# Patient Record
Sex: Male | Born: 1980 | Race: Black or African American | Hispanic: No | Marital: Married | Smoking: Never smoker
Health system: Southern US, Community
[De-identification: ages and names within clinical notes are randomized; demographics above are authoritative.]

---

## 2017-01-24 ENCOUNTER — Emergency Department (HOSPITAL_COMMUNITY)
Admission: EM | Admit: 2017-01-24 | Discharge: 2017-01-24 | Disposition: A | Discharge status: Home / Self Care | Payer: PRIVATE HEALTH INSURANCE | Attending: Emergency Medicine | Admitting: Emergency Medicine

## 2017-01-24 ENCOUNTER — Emergency Department (HOSPITAL_COMMUNITY): Payer: PRIVATE HEALTH INSURANCE

## 2017-01-24 ENCOUNTER — Encounter (HOSPITAL_COMMUNITY): Payer: Self-pay | Admitting: Emergency Medicine

## 2017-01-24 DIAGNOSIS — R109 Unspecified abdominal pain: Secondary | ICD-10-CM | POA: Diagnosis present

## 2017-01-24 DIAGNOSIS — R1031 Right lower quadrant pain: Secondary | ICD-10-CM | POA: Insufficient documentation

## 2017-01-24 LAB — URINALYSIS, ROUTINE W REFLEX MICROSCOPIC
BACTERIA UA: NONE SEEN
Bilirubin Urine: NEGATIVE
Glucose, UA: NEGATIVE mg/dL
KETONES UR: NEGATIVE mg/dL
LEUKOCYTES UA: NEGATIVE
Nitrite: NEGATIVE
PROTEIN: NEGATIVE mg/dL
Specific Gravity, Urine: 1.009 (ref 1.005–1.030)
pH: 7 (ref 5.0–8.0)

## 2017-01-24 LAB — COMPREHENSIVE METABOLIC PANEL
ALT: 34 U/L (ref 17–63)
AST: 31 U/L (ref 15–41)
Albumin: 4.3 g/dL (ref 3.5–5.0)
Alkaline Phosphatase: 55 U/L (ref 38–126)
Anion gap: 5 (ref 5–15)
BUN: 11 mg/dL (ref 6–20)
CHLORIDE: 104 mmol/L (ref 101–111)
CO2: 29 mmol/L (ref 22–32)
Calcium: 9.3 mg/dL (ref 8.9–10.3)
Creatinine, Ser: 1.23 mg/dL (ref 0.61–1.24)
GFR calc Af Amer: >60 mL/min (ref >60)
Glucose, Bld: 79 mg/dL (ref 65–99)
POTASSIUM: 4 mmol/L (ref 3.5–5.1)
SODIUM: 138 mmol/L (ref 135–145)
Total Bilirubin: 0.8 mg/dL (ref 0.3–1.2)
Total Protein: 7.5 g/dL (ref 6.5–8.1)

## 2017-01-24 LAB — CBC
HEMATOCRIT: 42.1 % (ref 39.0–52.0)
Hemoglobin: 14.2 g/dL (ref 13.0–17.0)
MCH: 28.7 pg (ref 26.0–34.0)
MCHC: 33.7 g/dL (ref 30.0–36.0)
MCV: 85.1 fL (ref 78.0–100.0)
Platelets: 252 10*3/uL (ref 150–400)
RBC: 4.95 MIL/uL (ref 4.22–5.81)
RDW: 13.7 % (ref 11.5–15.5)
WBC: 4 10*3/uL (ref 4.0–10.5)

## 2017-01-24 LAB — LIPASE, BLOOD: LIPASE: 22 U/L (ref 11–51)

## 2017-01-24 MED ORDER — IBUPROFEN 800 MG PO TABS
800.0000 mg | ORAL_TABLET | Freq: Once | ORAL | Status: AC
  Administered 2017-01-24: 800 mg via ORAL
  Filled 2017-01-24: qty 1

## 2017-01-24 MED ORDER — IBUPROFEN 800 MG PO TABS: 800.0000 mg | ORAL_TABLET | Freq: Three times a day (TID) | ORAL | 0 refills | Status: DC

## 2017-01-24 MED ORDER — IBUPROFEN 800 MG PO TABS: 800.0000 mg | ORAL_TABLET | Freq: Three times a day (TID) | ORAL | 0 refills | Status: AC | PRN

## 2017-01-24 NOTE — ED Triage Notes (Signed)
Per pt, states RLQ pain for 2 days-no N/V/D-no dysuria, no fevers

## 2017-01-24 NOTE — Discharge Instructions (Signed)

## 2017-01-24 NOTE — ED Provider Notes (Signed)
Emergency Department Provider Note   I have reviewed the triage vital signs and the nursing notes.   HISTORY  Chief Complaint Abdominal Pain   HPI Jesse Odom is a 36 y.o. male with no PMH presents to the emergency department for evaluation of right flank pain. Patient notes intermittent discomfort for the past 2 weeks. He has no history of kidney stones. A friend told him that I could be kidney stones and he started taking apple cider vinegar with relief of his back pain that his right side pain continues. No fevers or chills. No dysuria. No hematuria. No chest pain or difficulty breathing. He denies any anterior abdominal pain. Pain is made slightly worse with movement. Describes it as an aching quality with no significant alleviating factors. Moderate severity.   History reviewed. No pertinent past medical history.  There are no active problems to display for this patient.   History reviewed. No pertinent surgical history.    Allergies Patient has no known allergies.  No family history on file.  Social History Social History  Substance Use Topics  . Smoking status: Never Smoker  . Smokeless tobacco: Not on file  . Alcohol use No    Review of Systems  Constitutional: No fever/chills Eyes: No visual changes. ENT: No sore throat. Cardiovascular: Denies chest pain. Respiratory: Denies shortness of breath. Gastrointestinal: No abdominal pain.  No nausea, no vomiting.  No diarrhea.  No constipation. Genitourinary: Negative for dysuria. Positive right flank pain.  Musculoskeletal: Negative for back pain. Skin: Negative for rash. Neurological: Negative for headaches, focal weakness or numbness.  10-point ROS otherwise negative.  ____________________________________________   PHYSICAL EXAM:  VITAL SIGNS: ED Triage Vitals  Enc Vitals Group     BP 01/24/17 1227 150/60     Pulse Rate 01/24/17 1227 65     Resp 01/24/17 1227 18     Temp 01/24/17 1227 97.4  F (36.3 C)     Temp Source 01/24/17 1227 Oral     SpO2 01/24/17 1227 100 %     Pain Score 01/24/17 1239 8   Constitutional: Alert and oriented. Well appearing and in no acute distress. Eyes: Conjunctivae are normal.  Head: Atraumatic. Nose: No congestion/rhinnorhea. Mouth/Throat: Mucous membranes are moist.  Oropharynx non-erythematous. Neck: No stridor.   Cardiovascular: Normal rate, regular rhythm. Good peripheral circulation. Grossly normal heart sounds.   Respiratory: Normal respiratory effort.  No retractions. Lungs CTAB. Gastrointestinal: Soft and nontender. No distention. No CVA tenderness.  Musculoskeletal: No lower extremity tenderness nor edema. No gross deformities of extremities. Neurologic:  Normal speech and language. No gross focal neurologic deficits are appreciated.  Skin:  Skin is warm, dry and intact. No rash noted. Psychiatric: Mood and affect are normal. Speech and behavior are normal.  ____________________________________________   LABS (all labs ordered are listed, but only abnormal results are displayed)  Labs Reviewed  URINALYSIS, ROUTINE W REFLEX MICROSCOPIC - Abnormal; Notable for the following:       Result Value   Color, Urine STRAW (*)    Hgb urine dipstick SMALL (*)    Squamous Epithelial / LPF 0-5 (*)    All other components within normal limits  LIPASE, BLOOD  COMPREHENSIVE METABOLIC PANEL  CBC   ____________________________________________  RADIOLOGY  Ct Renal Stone Study  Result Date: 01/24/2017 CLINICAL DATA:  Right lower quadrant abdominal pain for 2 days. EXAM: CT ABDOMEN AND PELVIS WITHOUT CONTRAST TECHNIQUE: Multidetector CT imaging of the abdomen and pelvis was performed following the  standard protocol without IV contrast. COMPARISON:  None. FINDINGS: Lower chest: No acute abnormality. Hepatobiliary: Unremarkable unenhanced appearance of the liver and gallbladder. Pancreas: Unremarkable unenhanced appearance. Spleen: Unremarkable  unenhanced appearance. Adrenals/Urinary Tract: The adrenal glands and kidneys are unremarkable in appearance. No evidence of renal calculi or hydronephrosis. Ureters and bladder have a normal appearance. Stomach/Bowel: Bowel shows no evidence of obstruction or inflammation. The appendix is well visualized and normal. No evidence of appendicolith. No free air, abscess or free fluid. Vascular/Lymphatic: No enlarged lymph nodes are seen. Other: No hernias identified. Musculoskeletal: No acute or significant osseous findings. IMPRESSION: No acute findings in the abdomen or pelvis. Normal CT appearance of the appendix. Electronically Signed   By: Irish Lack M.D.   On: 01/24/2017 15:20    ____________________________________________   PROCEDURES  Procedure(s) performed:   Procedures  None ____________________________________________   INITIAL IMPRESSION / ASSESSMENT AND PLAN / ED COURSE  Pertinent labs & imaging results that were available during my care of the patient were reviewed by me and considered in my medical decision making (see chart for details).  Patient presents to the emergency department for evaluation of right flank pain for the past 2 weeks. Pain is worse with movement. No CVA tenderness. Labs are largely unremarkable. No history of kidney stone. Pain may be musculoskeletal in etiology. Plan for noncontrast CT to evaluate for index case of kidney stone. Low suspicion for appendicitis with no anterior abdominal tenderness. Right upper quadrant is nontender to palpation. No testicular pain or symptoms.   03:28 PM Labs including urinalysis are normal. CT shows no evidence of stone. The appendix is well-visualized and normal. Plan for Motrin as needed for pain and suspect underlying musculoskeletal etiology. Discussed my plan and impression with the patient.  At this time, I do not feel there is any life-threatening condition present. I have reviewed and discussed all results  (EKG, imaging, lab, urine as appropriate), exam findings with patient. I have reviewed nursing notes and appropriate previous records.  I feel the patient is safe to be discharged home without further emergent workup. Discussed usual and customary return precautions. Patient and family (if present) verbalize understanding and are comfortable with this plan.  Patient will follow-up with their primary care provider. If they do not have a primary care provider, information for follow-up has been provided to them. All questions have been answered.  ____________________________________________  FINAL CLINICAL IMPRESSION(S) / ED DIAGNOSES  Final diagnoses:  Right flank pain     MEDICATIONS GIVEN DURING THIS VISIT:  Medications  ibuprofen (ADVIL,MOTRIN) tablet 800 mg (800 mg Oral Given 01/24/17 1445)     NEW OUTPATIENT MEDICATIONS STARTED DURING THIS VISIT:  Current Discharge Medication List    START taking these medications   Details  ibuprofen (ADVIL,MOTRIN) 800 MG tablet Take 1 tablet (800 mg total) by mouth every 8 (eight) hours as needed. Qty: 21 tablet, Refills: 0          Note:  This document was prepared using Dragon voice recognition software and may include unintentional dictation errors.  Alona Bene, MD Emergency Medicine   Maia Plan, MD 01/24/17 [phone]

## 2018-01-23 IMAGING — CT CT RENAL STONE PROTOCOL
2 of 3 series · 17 of 46 positions shown, 19 images · non-contrast
Comparison: None.

CLINICAL DATA: Right lower quadrant abdominal pain for 2 days.

EXAM:
CT ABDOMEN AND PELVIS WITHOUT CONTRAST
TECHNIQUE: Multidetector CT imaging of the abdomen and pelvis was performed
following the standard protocol without IV contrast.

[Series 4: lung · axial · 0.80mm/px · z∈[+376,+460]mm · 14 of 50 slices shown, 16 images]
[im 4/50  soft-tissue]
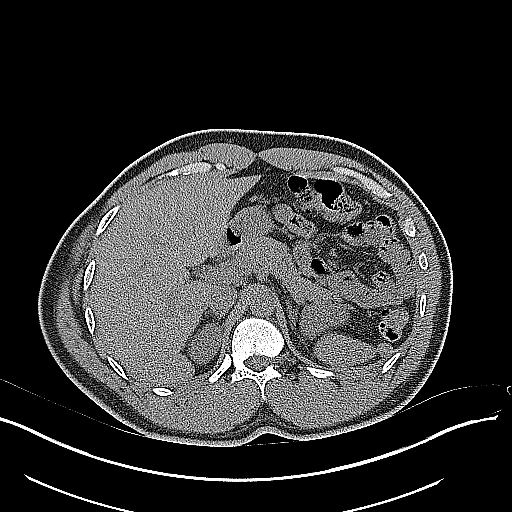
[im 4/50  bone]
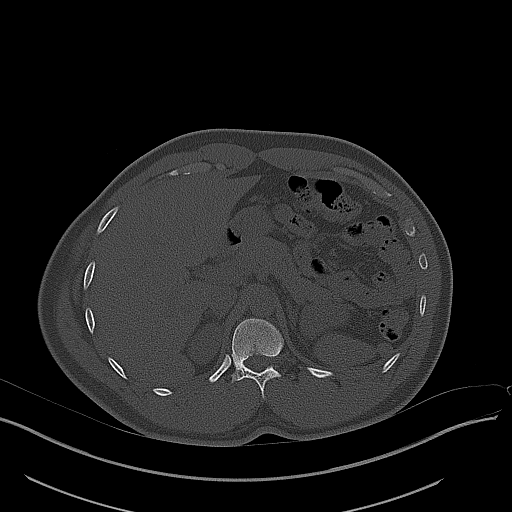
[im 7/50  soft-tissue]
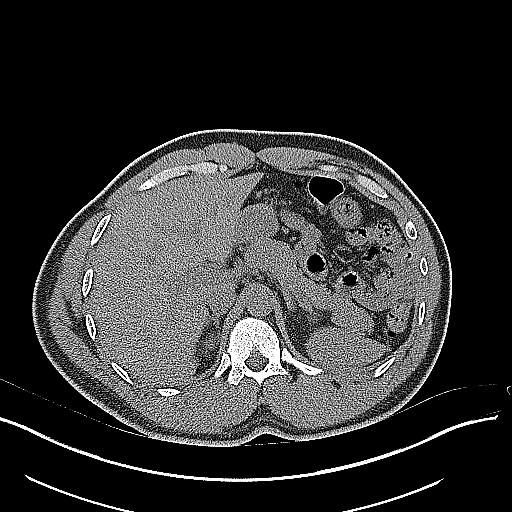
[im 10/50  soft-tissue]
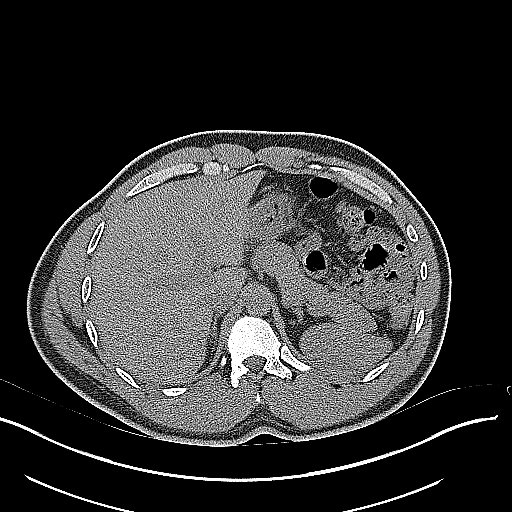
[im 13/50  soft-tissue]
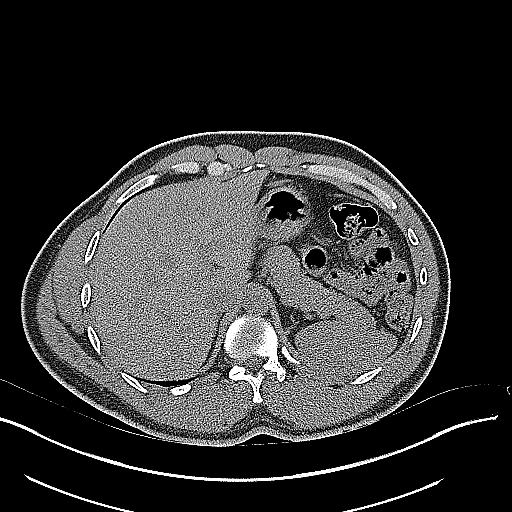
[im 16/50  soft-tissue]
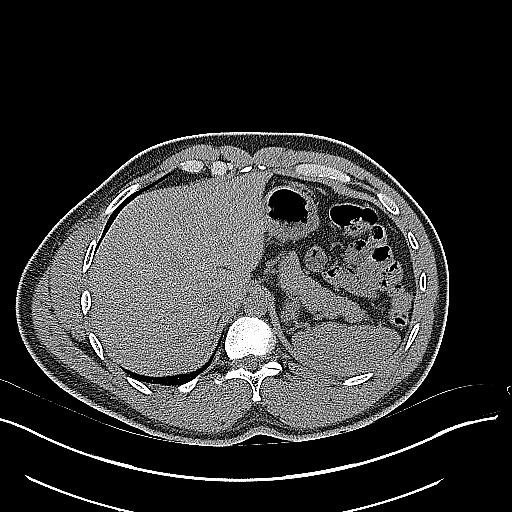
[im 19/50  soft-tissue]
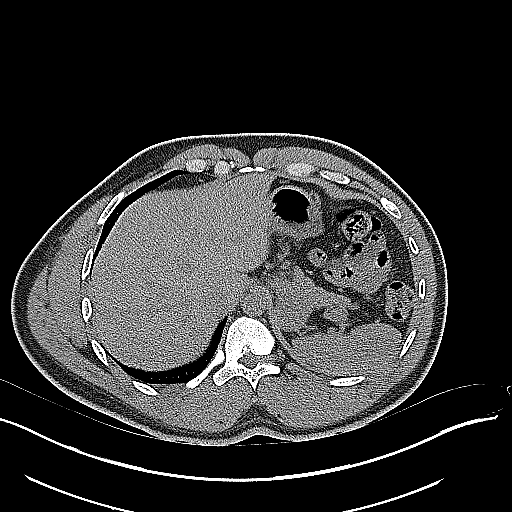
[im 23/50  soft-tissue]
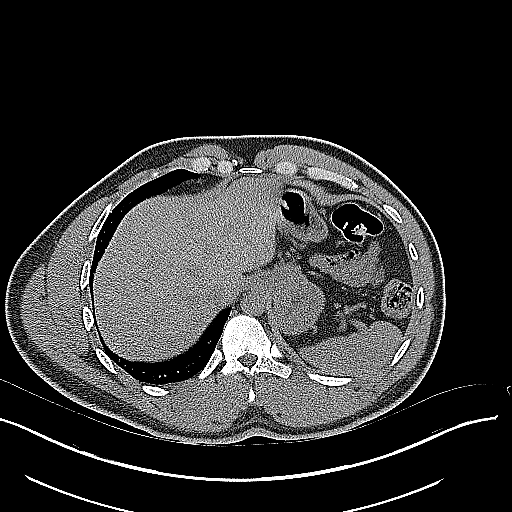
[im 27/50  soft-tissue]
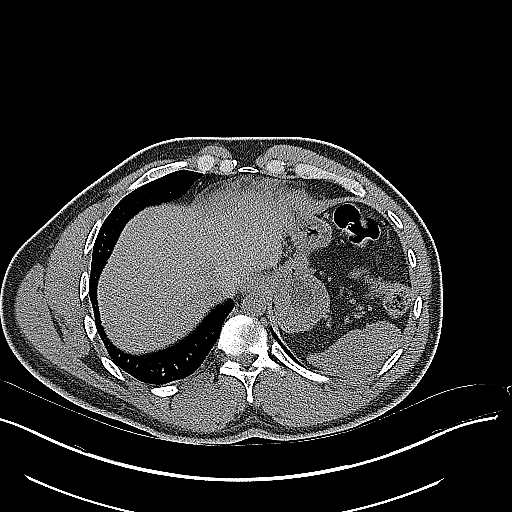
[im 31/50  soft-tissue]
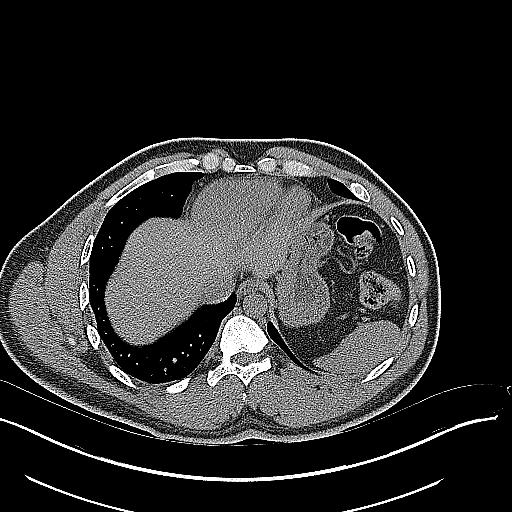
[im 31/50  bone]
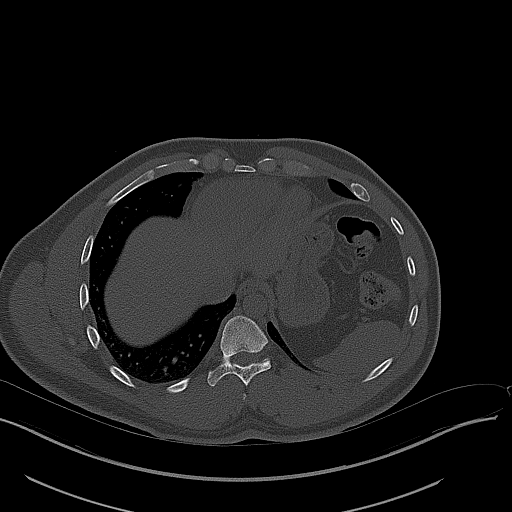
[im 34/50  soft-tissue]
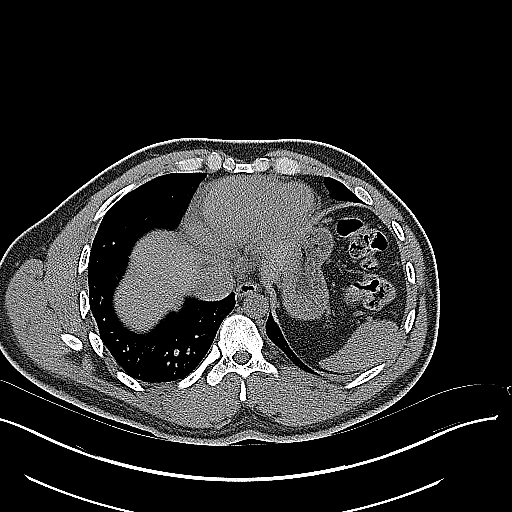
[im 37/50  soft-tissue]
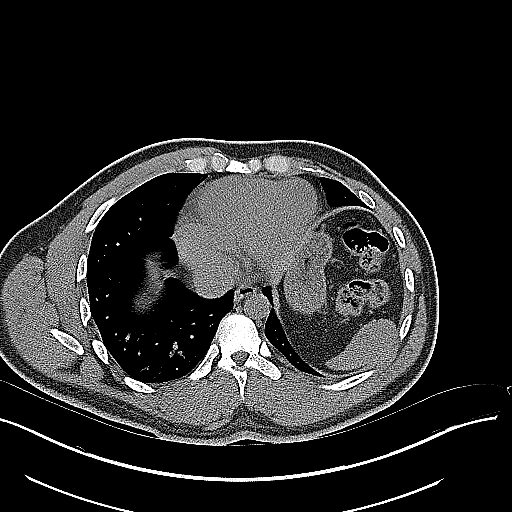
[im 40/50  soft-tissue]
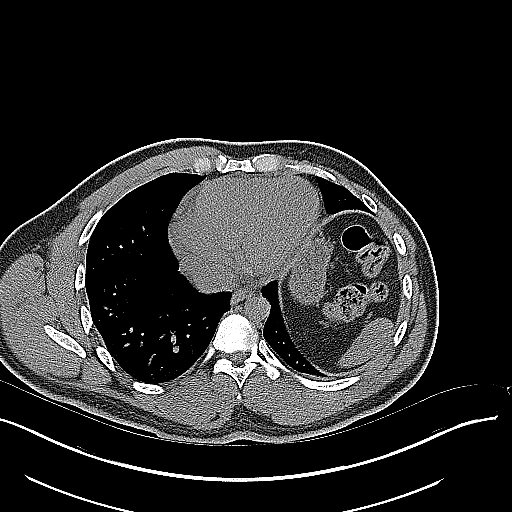
[im 43/50  soft-tissue]
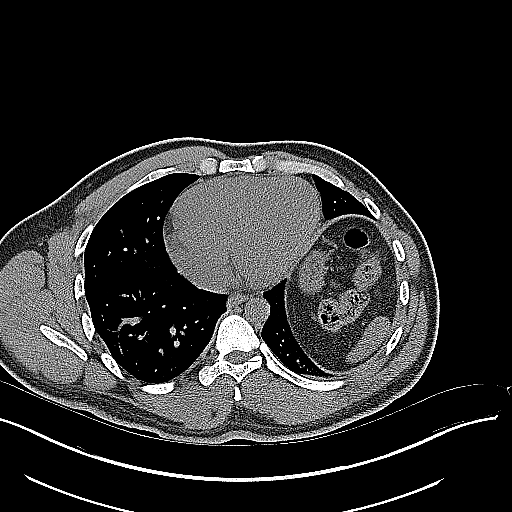
[im 46/50  soft-tissue]
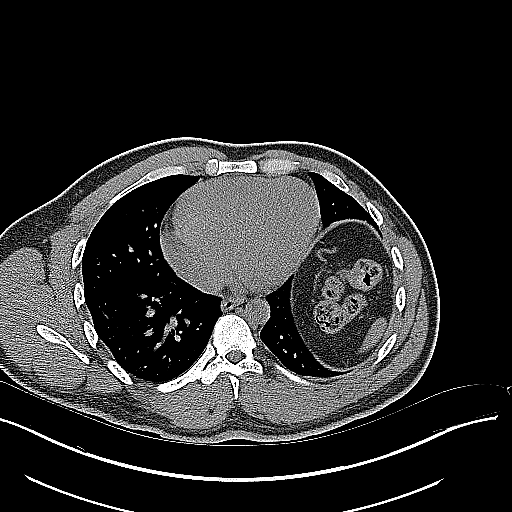

[Series 5: coronal · coronal · 0.74mm/px · 3 of 148 slices shown]
[im 50/148  soft-tissue]
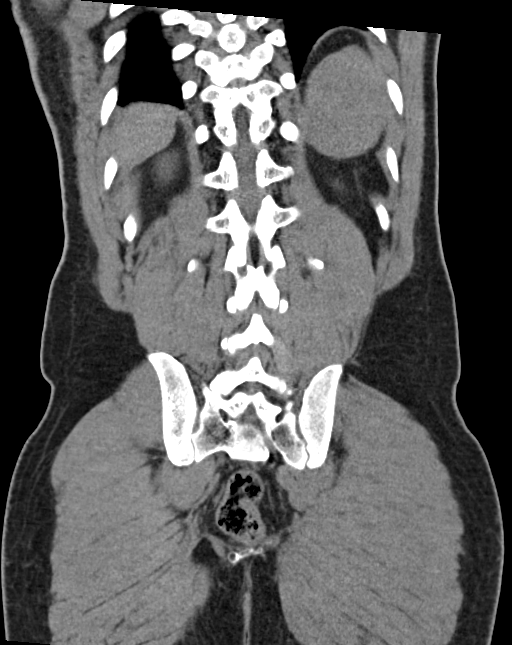
[im 66/148  soft-tissue]
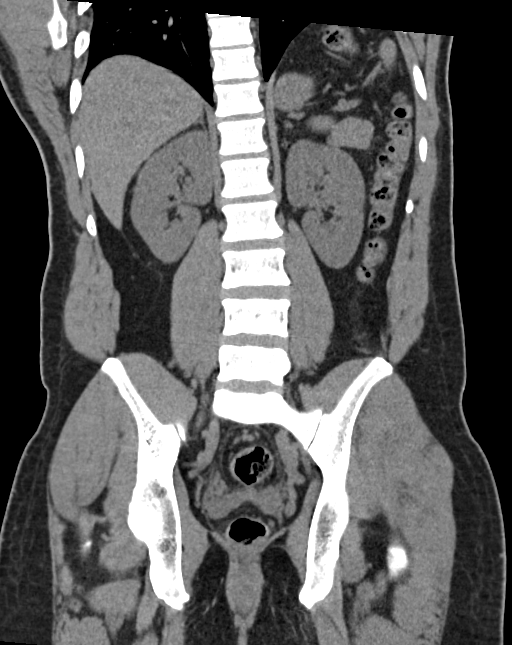
[im 82/148  soft-tissue]
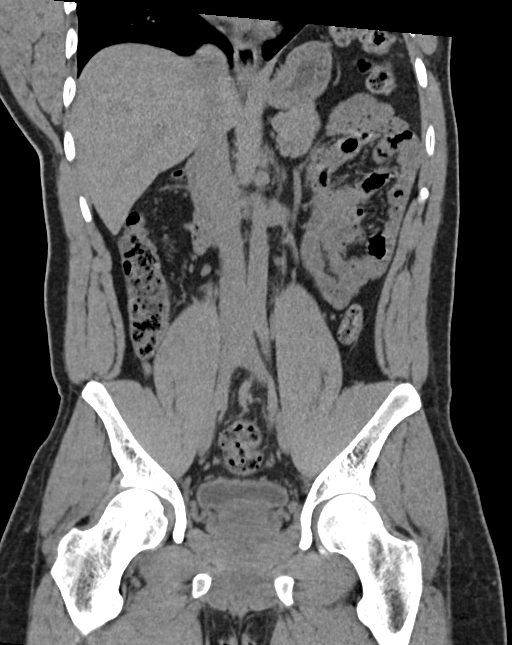

[17 of 46 positions shown; findings below may reference images not displayed]

FINDINGS: Lower chest: No acute abnormality.

Hepatobiliary: Unremarkable unenhanced appearance of the liver and
gallbladder.

Pancreas: Unremarkable unenhanced appearance.

Spleen: Unremarkable unenhanced appearance.

Adrenals/Urinary Tract: The adrenal glands and kidneys are
unremarkable in appearance. No evidence of renal calculi or
hydronephrosis. Ureters and bladder have a normal appearance.

Stomach/Bowel: Bowel shows no evidence of obstruction or
inflammation. The appendix is well visualized and normal. No
evidence of appendicolith. No free air, abscess or free fluid.

Vascular/Lymphatic: No enlarged lymph nodes are seen.

Other: No hernias identified.

Musculoskeletal: No acute or significant osseous findings.
IMPRESSION: No acute findings in the abdomen or pelvis. Normal CT appearance of
the appendix.

## 2020-06-18 DIAGNOSIS — R1011 Right upper quadrant pain: Secondary | ICD-10-CM | POA: Diagnosis not present

## 2020-06-18 DIAGNOSIS — Z13220 Encounter for screening for lipoid disorders: Secondary | ICD-10-CM | POA: Diagnosis not present

## 2020-06-18 DIAGNOSIS — Z0000 Encounter for general adult medical examination without abnormal findings: Secondary | ICD-10-CM | POA: Diagnosis not present

## 2020-07-18 DIAGNOSIS — R1011 Right upper quadrant pain: Secondary | ICD-10-CM | POA: Diagnosis not present

## 2020-07-23 ENCOUNTER — Encounter (HOSPITAL_COMMUNITY): Payer: Self-pay | Admitting: Emergency Medicine

## 2020-07-23 ENCOUNTER — Other Ambulatory Visit: Payer: Self-pay

## 2020-07-23 ENCOUNTER — Emergency Department (HOSPITAL_COMMUNITY)
Admission: EM | Admit: 2020-07-23 | Discharge: 2020-07-23 | Disposition: A | Discharge status: Home / Self Care | Payer: BC Managed Care – PPO | Attending: Emergency Medicine | Admitting: Emergency Medicine

## 2020-07-23 DIAGNOSIS — N4889 Other specified disorders of penis: Secondary | ICD-10-CM | POA: Diagnosis not present

## 2020-07-23 DIAGNOSIS — S30812A Abrasion of penis, initial encounter: Secondary | ICD-10-CM | POA: Diagnosis not present

## 2020-07-23 DIAGNOSIS — R52 Pain, unspecified: Secondary | ICD-10-CM | POA: Diagnosis not present

## 2020-07-23 DIAGNOSIS — Z23 Encounter for immunization: Secondary | ICD-10-CM | POA: Insufficient documentation

## 2020-07-23 DIAGNOSIS — S3021XA Contusion of penis, initial encounter: Secondary | ICD-10-CM

## 2020-07-23 MED ORDER — TETANUS-DIPHTH-ACELL PERTUSSIS 5-2.5-18.5 LF-MCG/0.5 IM SUSP
0.5000 mL | Freq: Once | INTRAMUSCULAR | Status: AC
  Administered 2020-07-23: 0.5 mL via INTRAMUSCULAR
  Filled 2020-07-23: qty 0.5

## 2020-07-23 MED ORDER — IBUPROFEN 400 MG PO TABS
400.0000 mg | ORAL_TABLET | Freq: Once | ORAL | Status: AC
  Administered 2020-07-23: 400 mg via ORAL
  Filled 2020-07-23: qty 1

## 2020-07-23 NOTE — Discharge Instructions (Addendum)
It was our pleasure to provide your ER care today - we hope that you feel better.  Icepack/cold to sore area. Take acetaminophen or ibuprofen as need for pain.   Return to ER if worse, unable to void/unable to empty bladder, or other concern.
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
## 2020-07-23 NOTE — ED Notes (Signed)
Called pt to recheck vitals. No response.  

## 2020-08-01 DIAGNOSIS — R1011 Right upper quadrant pain: Secondary | ICD-10-CM | POA: Diagnosis not present

## 2021-01-30 DIAGNOSIS — U071 COVID-19: Secondary | ICD-10-CM | POA: Diagnosis not present
